# Patient Record
Sex: Female | Born: 1960 | Race: White | Hispanic: No | Smoking: Never smoker
Health system: Southern US, Community
[De-identification: ages and names within clinical notes are randomized; demographics above are authoritative.]

## PROBLEM LIST (undated history)

## (undated) DIAGNOSIS — G5600 Carpal tunnel syndrome, unspecified upper limb: Secondary | ICD-10-CM

## (undated) DIAGNOSIS — M1A9XX0 Chronic gout, unspecified, without tophus (tophi): Secondary | ICD-10-CM

## (undated) DIAGNOSIS — M5136 Other intervertebral disc degeneration, lumbar region: Secondary | ICD-10-CM

## (undated) DIAGNOSIS — K219 Gastro-esophageal reflux disease without esophagitis: Secondary | ICD-10-CM

## (undated) DIAGNOSIS — F341 Dysthymic disorder: Secondary | ICD-10-CM

## (undated) HISTORY — DX: Gastro-esophageal reflux disease without esophagitis: K21.9

## (undated) HISTORY — DX: Carpal tunnel syndrome, unspecified upper limb: G56.00

## (undated) HISTORY — DX: Dysthymic disorder: F34.1

## (undated) HISTORY — DX: Other intervertebral disc degeneration, lumbar region: M51.36

## (undated) HISTORY — PX: OOPHORECTOMY: SHX86

## (undated) HISTORY — DX: Chronic gout, unspecified, without tophus (tophi): M1A.9XX0

## (undated) HISTORY — PX: ABDOMINAL HYSTERECTOMY: SHX81

---

## 2006-06-28 ENCOUNTER — Ambulatory Visit (HOSPITAL_COMMUNITY)
Admission: RE | Admit: 2006-06-28 | Discharge: 2006-06-28 | Payer: Self-pay | Admitting: Physical Medicine and Rehabilitation

## 2009-01-22 ENCOUNTER — Ambulatory Visit: Payer: Self-pay | Admitting: Radiology

## 2009-01-22 ENCOUNTER — Emergency Department (HOSPITAL_BASED_OUTPATIENT_CLINIC_OR_DEPARTMENT_OTHER): Admission: EM | Admit: 2009-01-22 | Discharge: 2009-01-22 | Payer: Self-pay | Admitting: Emergency Medicine

## 2009-09-23 IMAGING — CR DG CERVICAL SPINE COMPLETE 4+V
7 series · 7 of 7 positions shown · non-contrast
Comparison: None

CLINICAL DATA: Trauma

CERVICAL SPINE - COMPLETE 4+ VIEW

[w c-spine lat]
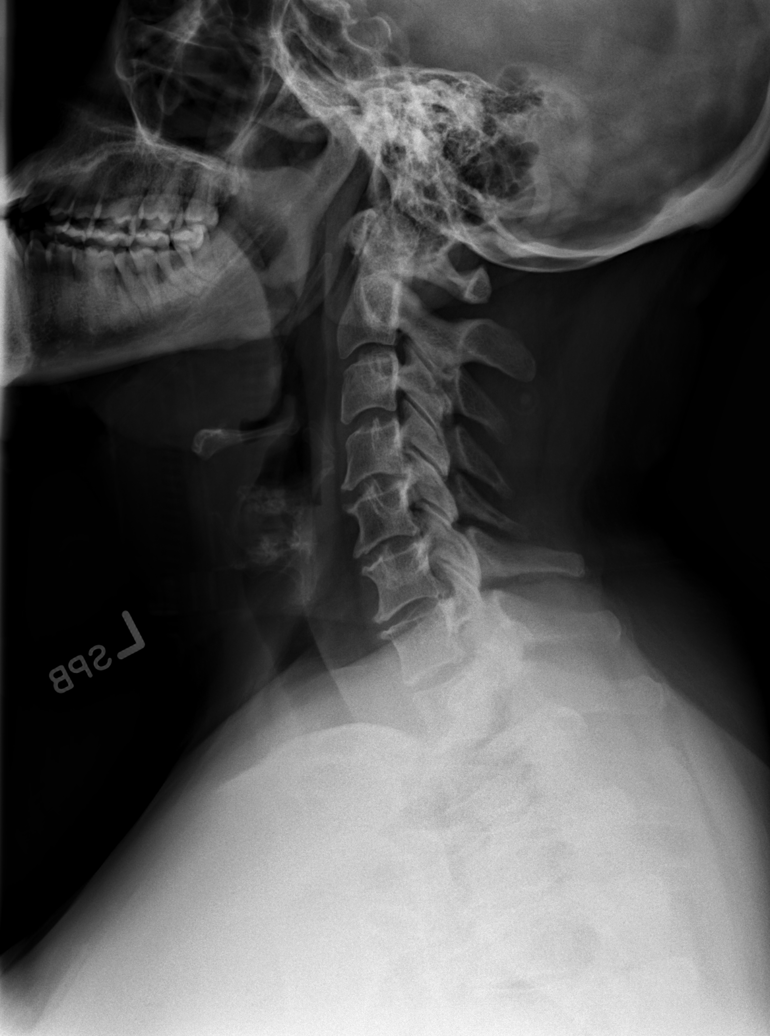

[w c-spine oblique (1 of 2)]
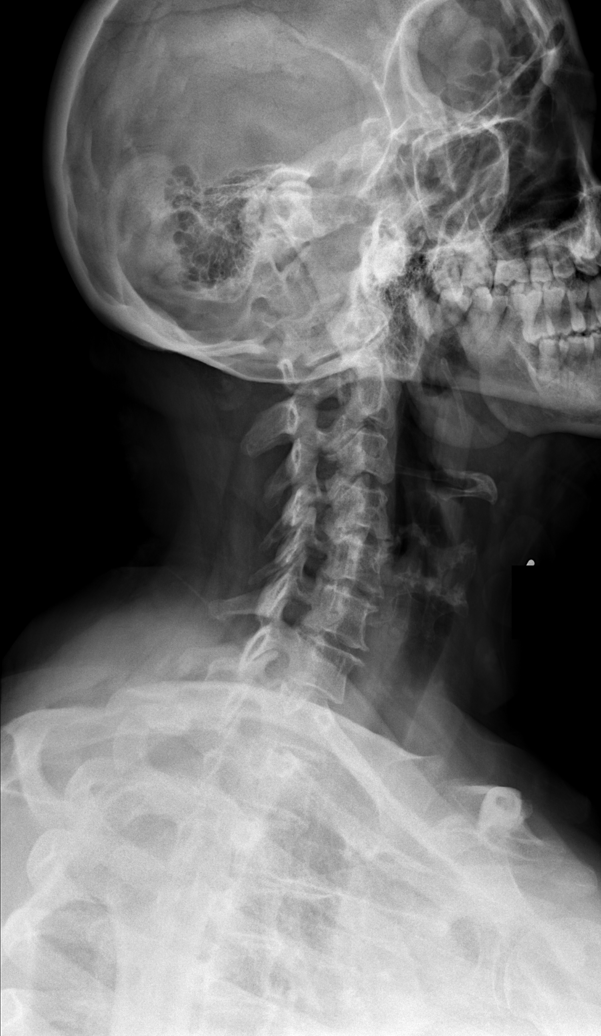

[w c-spine oblique (2 of 2)]
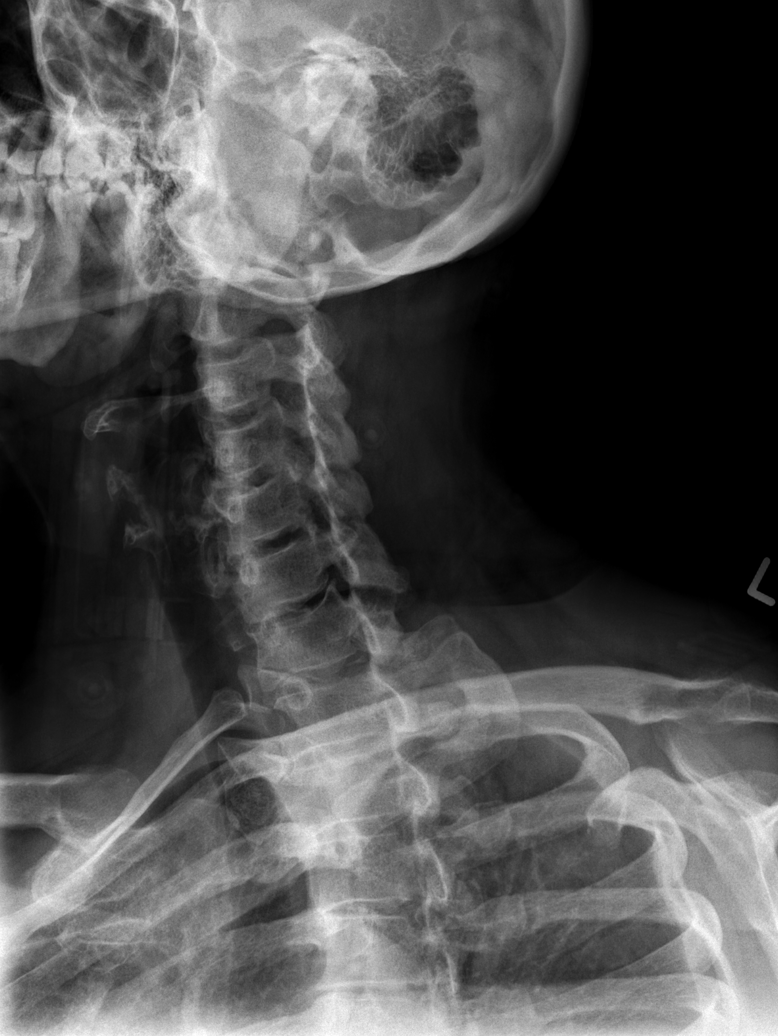

[w c-spine a.p.]
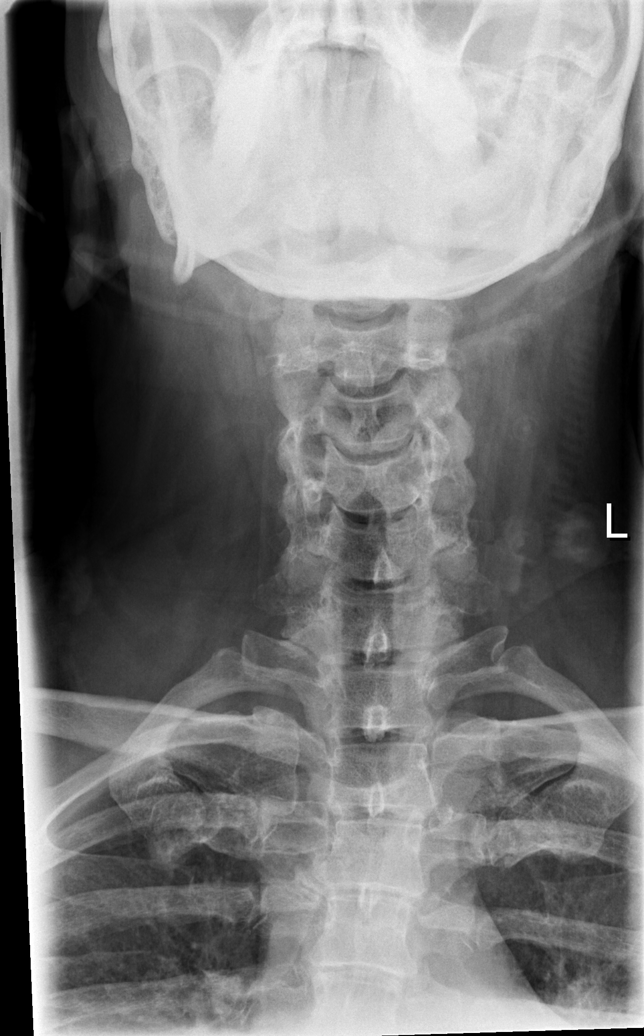

[w c-spine odontoid (1 of 2)]
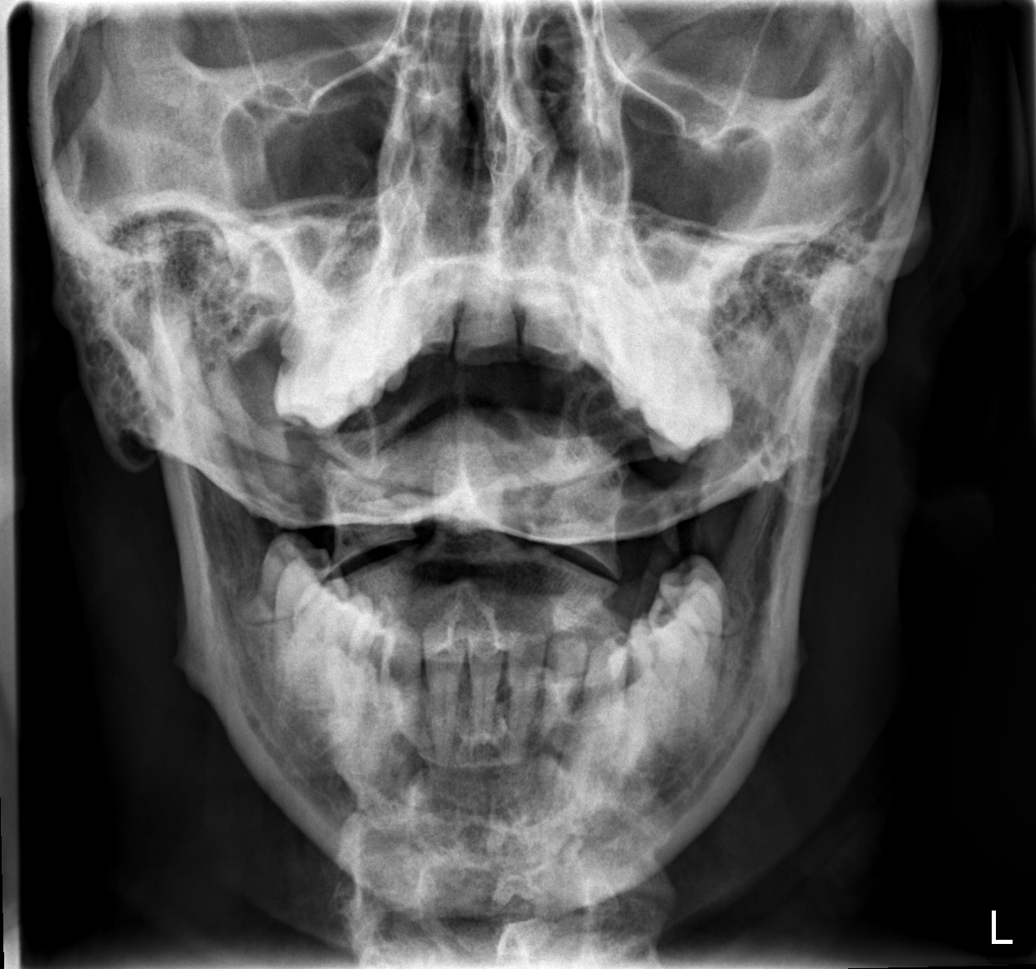

[w c-spine odontoid (2 of 2)]
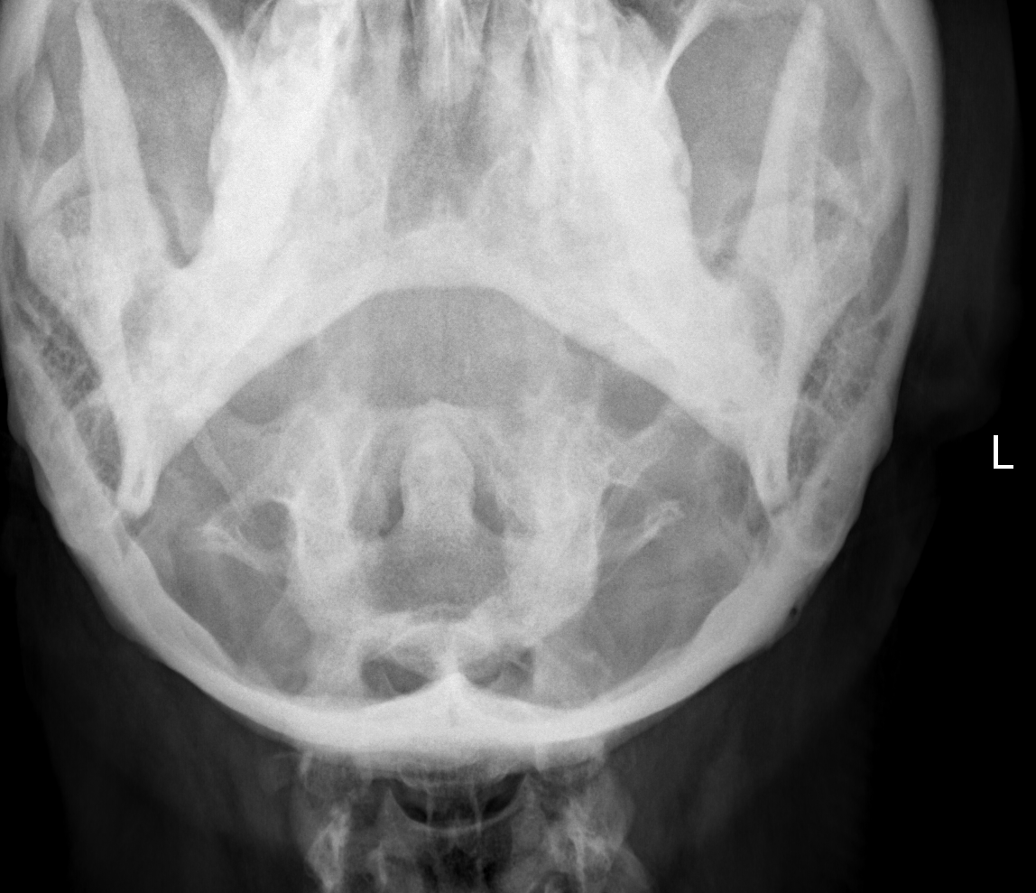

[w swimmers view]
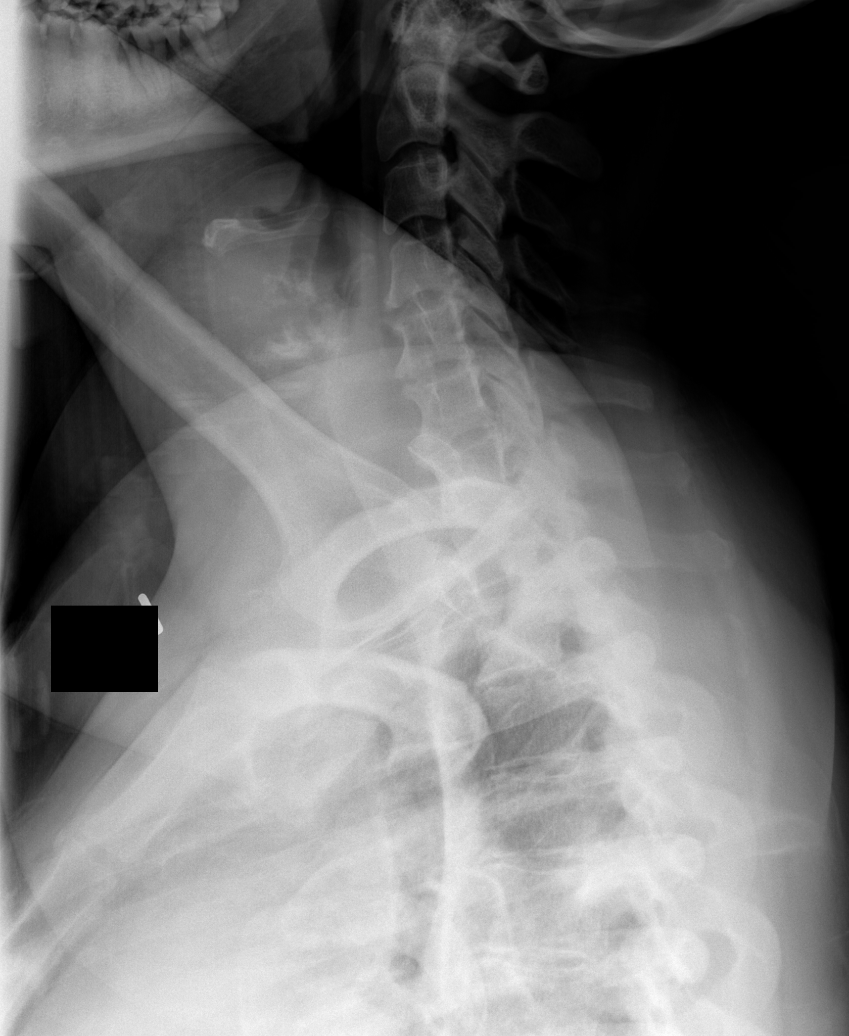

[7 of 7 positions shown; findings below may reference images not displayed]

FINDINGS: Prevertebral soft tissues normal.  Anatomic alignment.
Degenerative disc disease is noted at the C4-C5, C5-C6 and C6-C7
levels.  Facet degenerative changes within the mid cervical spine.
The craniocervical and the cervical thoracic junctions are intact.
IMPRESSION: There is no evidence for fracture, subluxation or static signs of
instability.

## 2009-09-23 IMAGING — CT CT HEAD W/O CM
1 series · 16 of 30 positions shown, 20 images · non-contrast
Comparison: 

CLINICAL DATA: Trauma

CT HEAD WITHOUT CONTRAST
TECHNIQUE: Contiguous axial images were obtained from the base of
the skull through the vertex without contrast.

[Series 2: head 4.8 h37s · axial · 0.43mm/px · z∈[-116,+24]mm · 16 of 32 slices shown, 20 images]
[im 2/32  brain]
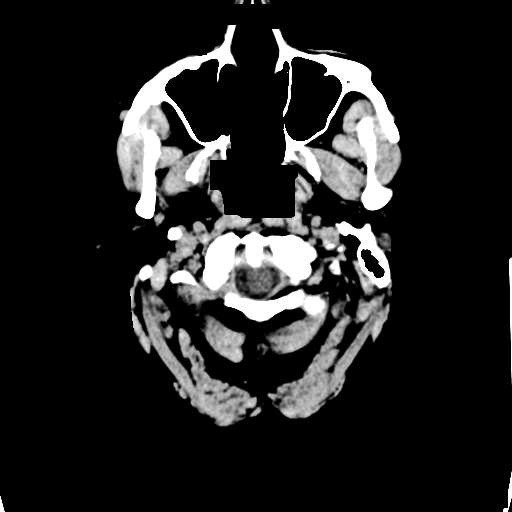
[im 2/32  bone]
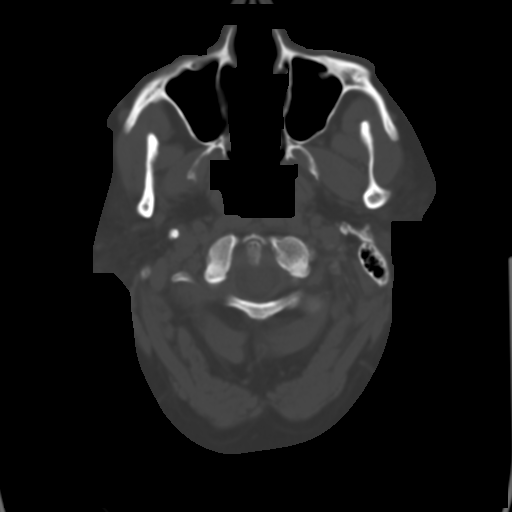
[im 4/32  brain]
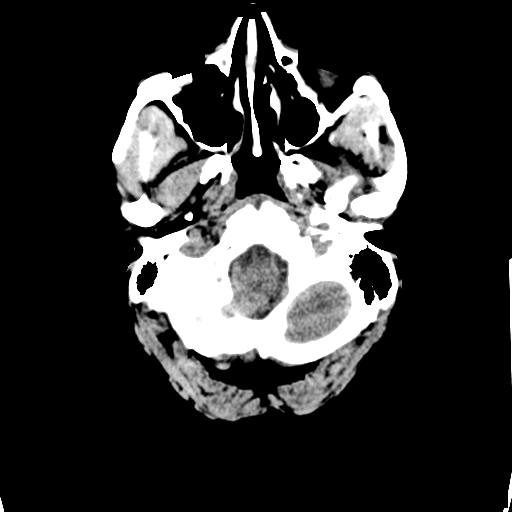
[im 6/32  brain]
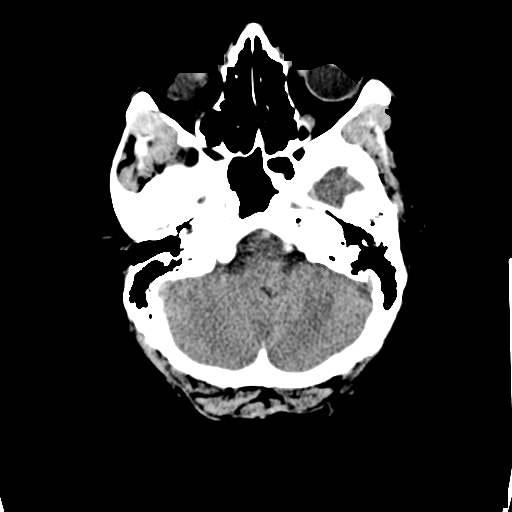
[im 8/32  brain]
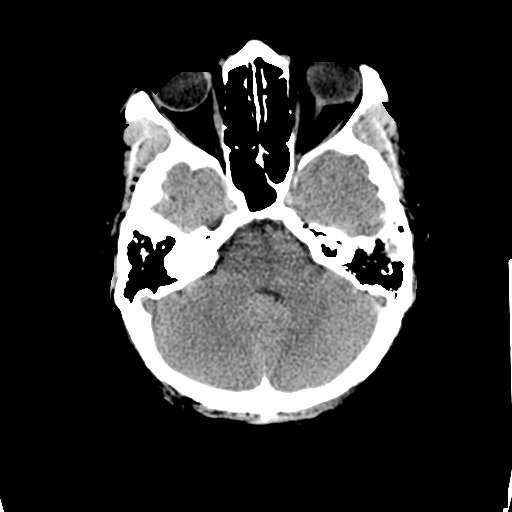
[im 9/32  brain]
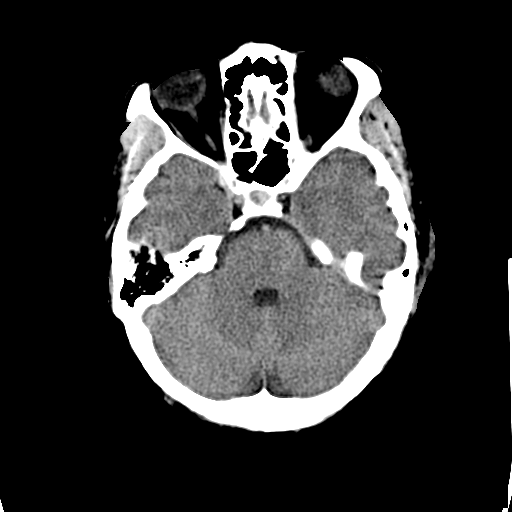
[im 9/32  bone]
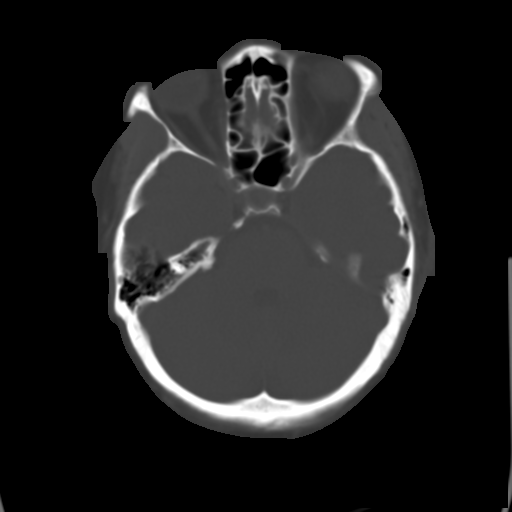
[im 11/32  brain]
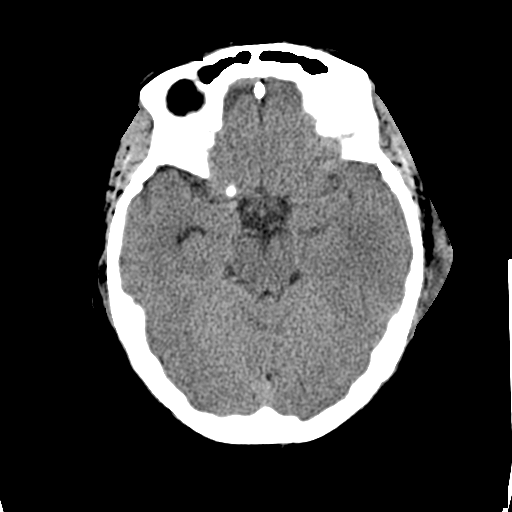
[im 13/32  brain]
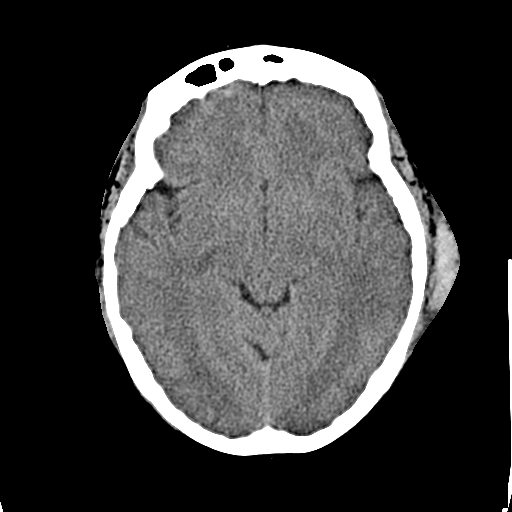
[im 15/32  brain]
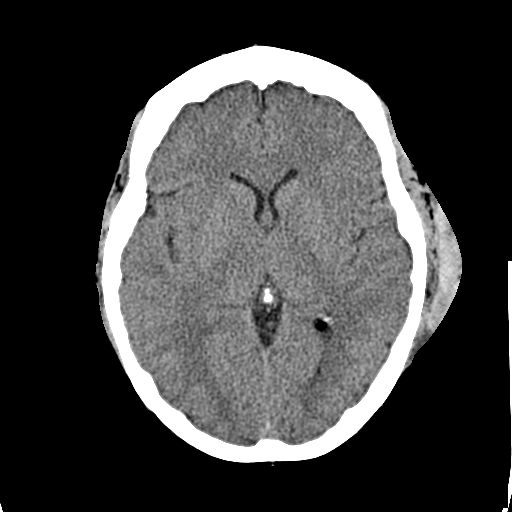
[im 17/32  brain]
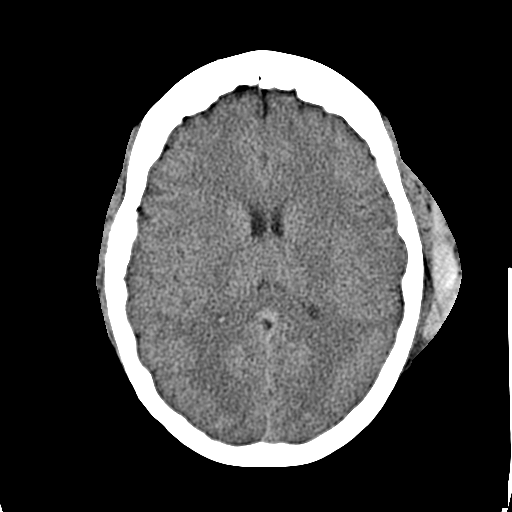
[im 17/32  bone]
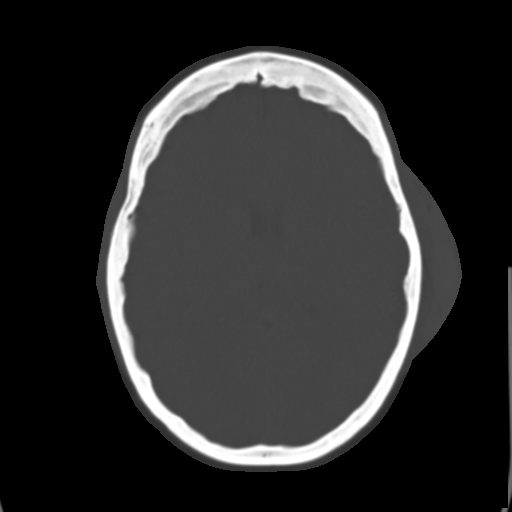
[im 19/32  brain]
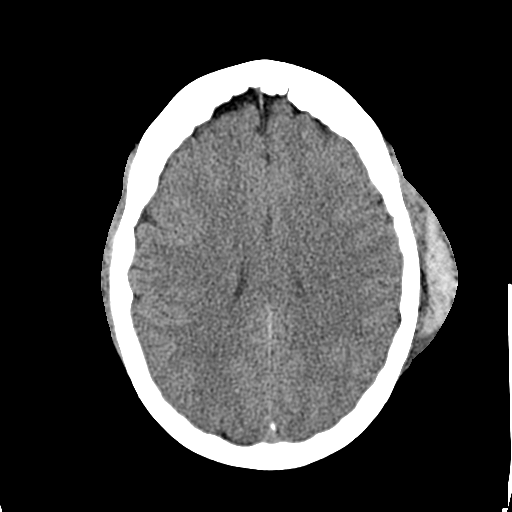
[im 21/32  brain]
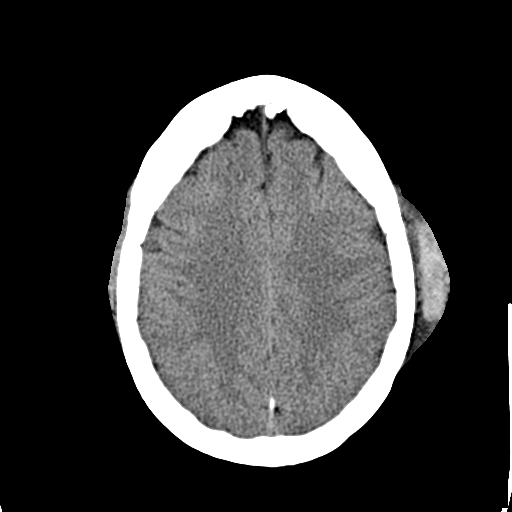
[im 23/32  brain]
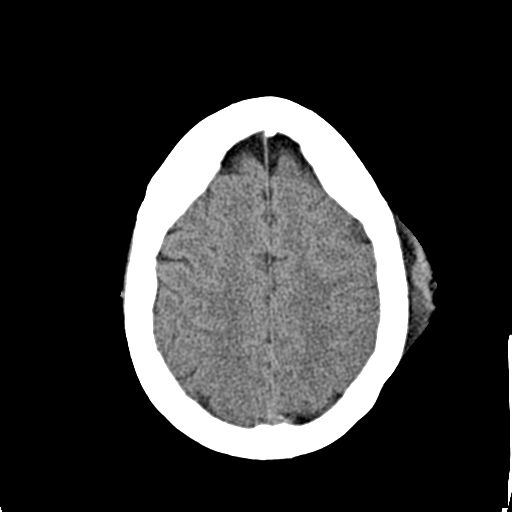
[im 24/32  brain]
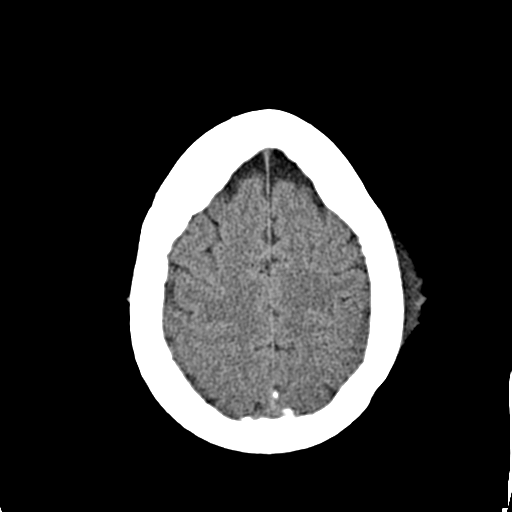
[im 24/32  bone]
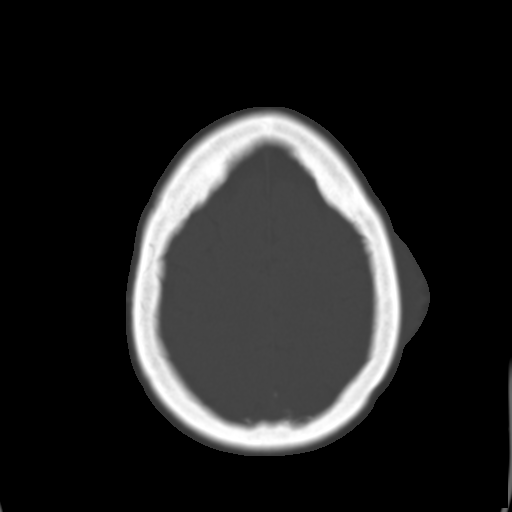
[im 26/32  brain]
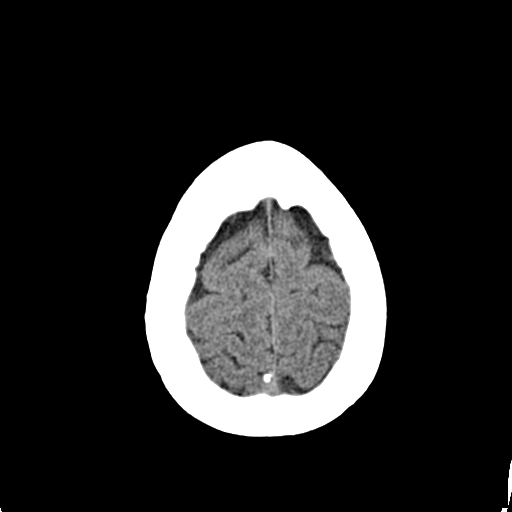
[im 28/32  brain]
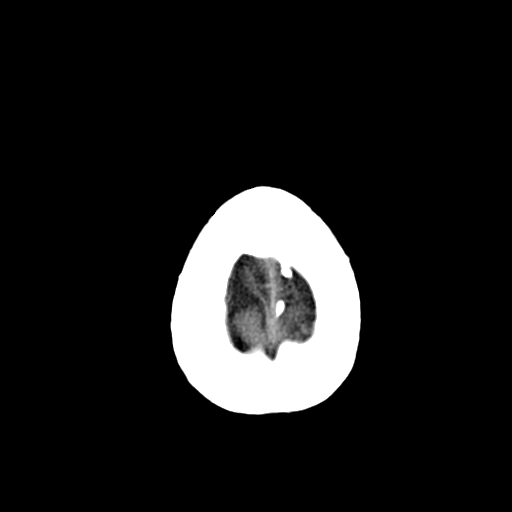
[im 30/32  brain]
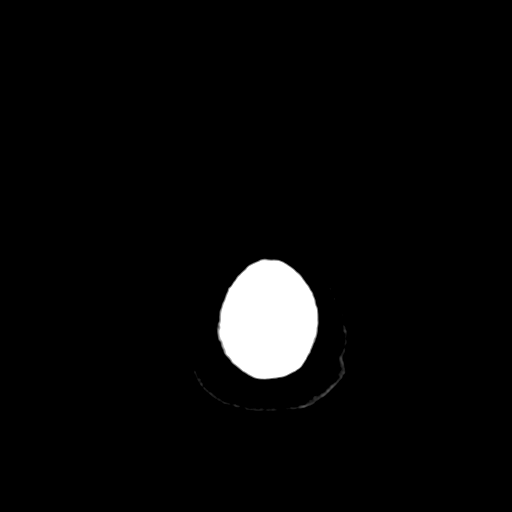

[16 of 30 positions shown; findings below may reference images not displayed]

FINDINGS: A left lateral scalp hematoma is identified.  No
underlying fracture.  The ventricles and basilar cisterns are
midline without effacement or mass effect.  There is no acute
intracranial hemorrhage or edema.  The skull base is intact.
Sinuses clear
IMPRESSION: Left scalp hematoma.  No underlying fracture.  No intracranial
hemorrhage or edema.

## 2010-10-14 ENCOUNTER — Encounter: Payer: Self-pay | Admitting: Physical Medicine and Rehabilitation

## 2013-08-11 ENCOUNTER — Other Ambulatory Visit: Payer: Self-pay | Admitting: Physical Medicine and Rehabilitation

## 2013-08-11 DIAGNOSIS — G894 Chronic pain syndrome: Secondary | ICD-10-CM

## 2013-08-11 DIAGNOSIS — M4802 Spinal stenosis, cervical region: Secondary | ICD-10-CM

## 2013-08-11 DIAGNOSIS — M5030 Other cervical disc degeneration, unspecified cervical region: Secondary | ICD-10-CM

## 2013-08-11 DIAGNOSIS — M5412 Radiculopathy, cervical region: Secondary | ICD-10-CM

## 2013-08-11 DIAGNOSIS — M5020 Other cervical disc displacement, unspecified cervical region: Secondary | ICD-10-CM

## 2013-08-21 ENCOUNTER — Other Ambulatory Visit: Payer: Self-pay

## 2016-02-29 DIAGNOSIS — I10 Essential (primary) hypertension: Secondary | ICD-10-CM

## 2016-02-29 DIAGNOSIS — E782 Mixed hyperlipidemia: Secondary | ICD-10-CM | POA: Insufficient documentation

## 2016-02-29 DIAGNOSIS — F064 Anxiety disorder due to known physiological condition: Secondary | ICD-10-CM

## 2016-02-29 HISTORY — DX: Essential (primary) hypertension: I10

## 2016-02-29 HISTORY — DX: Mixed hyperlipidemia: E78.2

## 2016-02-29 HISTORY — DX: Anxiety disorder due to known physiological condition: F06.4

## 2016-10-15 ENCOUNTER — Other Ambulatory Visit: Payer: Self-pay | Admitting: Pain Medicine

## 2016-10-31 ENCOUNTER — Other Ambulatory Visit: Payer: Self-pay | Admitting: Pain Medicine

## 2016-11-28 ENCOUNTER — Other Ambulatory Visit: Payer: Self-pay | Admitting: Pain Medicine

## 2018-08-15 DIAGNOSIS — K5909 Other constipation: Secondary | ICD-10-CM

## 2018-08-15 HISTORY — DX: Other constipation: K59.09

## 2020-05-27 DIAGNOSIS — I959 Hypotension, unspecified: Secondary | ICD-10-CM

## 2020-05-27 DIAGNOSIS — R079 Chest pain, unspecified: Secondary | ICD-10-CM

## 2020-06-02 DIAGNOSIS — R5383 Other fatigue: Secondary | ICD-10-CM | POA: Insufficient documentation

## 2020-06-02 DIAGNOSIS — K219 Gastro-esophageal reflux disease without esophagitis: Secondary | ICD-10-CM | POA: Insufficient documentation

## 2020-06-02 DIAGNOSIS — K625 Hemorrhage of anus and rectum: Secondary | ICD-10-CM | POA: Insufficient documentation

## 2020-06-02 DIAGNOSIS — R5381 Other malaise: Secondary | ICD-10-CM | POA: Insufficient documentation

## 2020-06-10 ENCOUNTER — Encounter: Payer: Self-pay | Admitting: Physician Assistant

## 2020-07-12 DIAGNOSIS — E349 Endocrine disorder, unspecified: Secondary | ICD-10-CM

## 2020-07-12 DIAGNOSIS — F419 Anxiety disorder, unspecified: Secondary | ICD-10-CM | POA: Insufficient documentation

## 2020-07-12 HISTORY — DX: Endocrine disorder, unspecified: E34.9

## 2020-07-13 ENCOUNTER — Ambulatory Visit: Payer: Medicare Other | Admitting: Physician Assistant

## 2020-07-13 ENCOUNTER — Encounter: Payer: Self-pay | Admitting: Physician Assistant

## 2020-07-13 VITALS — BP 90/60 | HR 64 | Ht 63.0 in | Wt 173.0 lb

## 2020-07-13 DIAGNOSIS — K5900 Constipation, unspecified: Secondary | ICD-10-CM | POA: Diagnosis not present

## 2020-07-13 DIAGNOSIS — K625 Hemorrhage of anus and rectum: Secondary | ICD-10-CM

## 2020-07-13 DIAGNOSIS — Z800 Family history of malignant neoplasm of digestive organs: Secondary | ICD-10-CM

## 2020-07-13 MED ORDER — PLENVU 140 G PO SOLR: 1.0000 | ORAL | 0 refills | Status: DC

## 2020-07-13 NOTE — Progress Notes (Signed)
Subjective:    Patient ID: Cassie Sandoval, female    DOB: 1961/08/12, 59 y.o.   MRN: [id number]  HPI Cassie Sandoval is a pleasant 59 year old female, new to GI today referred by her PCP/Melissa Brown-Patrim  NP for evaluation of rectal bleeding and to discuss colonoscopy. Patient also relates chronic history of constipation. She has history of hypertension, GERD and anxiety. She had undergone prior colonoscopy with Dr. Lyda Jester, she relates she did not want to return to him as she did not have adequate sedation at the time of her colonoscopy done in December 2016.  She was found to have mild diverticulosis in the left colon and small internal hemorrhoids, no polyps. Patient has family history of colon cancer in her daughter diagnosed at age 14. Patient says she has been noticing very small amounts of blood off and on over the past few months and occasionally a small amount of bright red blood in the commode.  Blood is primarily been noted on the tissue and has not been mixed with her bowel movements.  She denies any rectal pain or discomfort.  She has long-term history of constipation dating back years.  She says she may go as long as 2 to 3 weeks without a bowel movement.  She uses Ex-Lax as needed but not on daily basis.  When questioned she says she has tried MiraLAX in the past but has not tried taking it on a regular basis. After recent visit with primary care she was noted to be heme positive.  Review of Systems Pertinent positive and negative review of systems were noted in the above HPI section.  All other review of systems was otherwise negative.  Outpatient Encounter Medications as of 07/13/2020  Medication Sig  . allopurinol (ZYLOPRIM) 100 MG tablet Take 1 tablet by mouth daily.  Marland Kitchen ascorbic acid (VITAMIN C) 1000 MG tablet Take 1 tablet by mouth daily.  Marland Kitchen b complex vitamins capsule Take 1 capsule by mouth daily.  Marland Kitchen escitalopram (LEXAPRO) 5 MG tablet Take 1 tablet by mouth daily.  Marland Kitchen estradiol  (ESTRACE) 0.5 MG tablet Take 1 tablet by mouth daily.  . fentaNYL (DURAGESIC) 75 MCG/HR Place 1 patch onto the skin every 3 (three) days.  Marland Kitchen gabapentin (NEURONTIN) 100 MG capsule Take 3 capsules by mouth in the morning, at noon, and at bedtime.   . Lactobacillus Rhamnosus, GG, (RA PROBIOTIC DIGESTIVE CARE) CAPS Take 1 capsule by mouth daily.  . Multiple Vitamin (MULTI-VITAMIN) tablet Take 1 tablet by mouth daily.  . naloxone (NARCAN) 4 MG/0.1ML LIQD nasal spray kit Call 911 if opioid overdose. Inject spray into nostril. If no response after 2 min,, inject spray into opposite nostril.  . Oxycodone HCl 20 MG TABS Take 0.5-1 tablets by mouth every 6 (six) hours as needed.  . pantoprazole (PROTONIX) 40 MG tablet Take 1 tablet by mouth daily.  . simvastatin (ZOCOR) 40 MG tablet Take 1 tablet by mouth daily.  Marland Kitchen tiZANidine (ZANAFLEX) 4 MG tablet Take 0.5 tablets by mouth 2 (two) times daily.  Marland Kitchen venlafaxine XR (EFFEXOR-XR) 150 MG 24 hr capsule Take 1 capsule by mouth daily.  Marland Kitchen amitriptyline (ELAVIL) 10 MG tablet Take 1-2 tablets by mouth at bedtime. (Patient not taking: Reported on 07/13/2020)  . PEG-KCl-NaCl-NaSulf-Na Asc-C (PLENVU) 140 g SOLR Take 1 kit by mouth as directed.   No facility-administered encounter medications on file as of 07/13/2020.   Allergies  Allergen Reactions  . Morphine Nausea Only    Patient states that it  paralyzes her stomach    Patient Active Problem List   Diagnosis Date Noted  . Anxiety 07/12/2020  . Hormone disorder 07/12/2020  . Gastroesophageal reflux disease without esophagitis 06/02/2020  . Malaise and fatigue 06/02/2020  . Painless rectal bleeding 06/02/2020  . Chronic constipation 08/15/2018  . Anxiety disorder due to medical condition 02/29/2016  . Essential hypertension 02/29/2016  . Mixed hyperlipidemia 02/29/2016   Social History   Socioeconomic History  . Marital status: Widowed    Spouse name: Not on file  . Number of children: 2  . Years of  education: Not on file  . Highest education level: Not on file  Occupational History  . Not on file  Tobacco Use  . Smoking status: Never Smoker  . Smokeless tobacco: Never Used  Vaping Use  . Vaping Use: Never used  Substance and Sexual Activity  . Alcohol use: Not Currently  . Drug use: Never  . Sexual activity: Not Currently  Other Topics Concern  . Not on file  Social History Narrative  . Not on file   Social Determinants of Health   Financial Resource Strain:   . Difficulty of Paying Living Expenses: Not on file  Food Insecurity:   . Worried About Charity fundraiser in the Last Year: Not on file  . Ran Out of Food in the Last Year: Not on file  Transportation Needs:   . Lack of Transportation (Medical): Not on file  . Lack of Transportation (Non-Medical): Not on file  Physical Activity:   . Days of Exercise per Week: Not on file  . Minutes of Exercise per Session: Not on file  Stress:   . Feeling of Stress : Not on file  Social Connections:   . Frequency of Communication with Friends and Family: Not on file  . Frequency of Social Gatherings with Friends and Family: Not on file  . Attends Religious Services: Not on file  . Active Member of Clubs or Organizations: Not on file  . Attends Archivist Meetings: Not on file  . Marital Status: Not on file  Intimate Partner Violence:   . Fear of Current or Ex-Partner: Not on file  . Emotionally Abused: Not on file  . Physically Abused: Not on file  . Sexually Abused: Not on file    Ms. 23 family history includes CAD in her father; COPD in her father; Cancer in her mother; Colon cancer (age of onset: 52) in her daughter; Heart disease in her mother.      Objective:    Vitals:   07/13/20 1509  BP: 90/60  Pulse: 64    Physical Exam Well-developed well-nourished WF in no acute distress.  Height, Weight,173 BMI 30.6  HEENT; nontraumatic normocephalic, EOMI, PE RR LA, sclera anicteric. Oropharynx;not  done Neck; supple, no JVD Cardiovascular; regular rate and rhythm with S1-S2, no murmur rub or gallop Pulmonary; Clear bilaterally Abdomen; soft, nontender, nondistended, no palpable mass or hepatosplenomegaly, bowel sounds are active Rectal;not done today Skin; benign exam, no jaundice rash or appreciable lesions Extremities; no clubbing cyanosis or edema skin warm and dry Neuro/Psych; alert and oriented x4, grossly nonfocal mood and affect appropriate       Assessment & Plan:   #59 59 year old white female with intermittent low-grade bright red blood with bowel movements, chronic significant constipation and family history of colon cancer in patient's daughter diagnosed at age 51.  Patient had colonoscopy in December 2016 per Dr. Lyda Jester with finding of mild  diverticulosis and small internal hemorrhoids, no polyps. Due for 5-year interval follow-up  #2 hypertension #3.  Anxiety #4.  Chronic pain syndrome/chronic degenerative disc disease-narcotic dependent  Plan; patient will be scheduled for colonoscopy with Dr. Lyndel Safe.  Procedure was discussed in detail with patient including indications risks and benefits and she is agreeable to proceed Patient has completed COVID-19 vaccination. Start trial of MiraLAX 17 g in 8 ounces of water every day, we also discussed titrating the dose to 1-1/2 doses daily as needed.  She is asked to try this for a couple of weeks.  If she does not have good success with MiraLAX I have also given her samples of Linzess 145 mcg which she will then start once daily. Asked her to call back if she would like a prescription for Linzess and/or if the current dose of Linzess is ineffective. Continue liberal water intake at least 60 to 70 ounces per day.  Taleen Prosser S Fatiha Guzy PA-C 07/13/2020   Cc: Bess Harvest*

## 2020-07-13 NOTE — Patient Instructions (Signed)
If you are age 60 or older, your body mass index should be between 23-30. Your Body mass index is 30.65 kg/m. If this is out of the aforementioned range listed, please consider follow up with your Primary Care Provider.  If you are age 77 or younger, your body mass index should be between 19-25. Your Body mass index is 30.65 kg/m. If this is out of the aformentioned range listed, please consider follow up with your Primary Care Provider.   You have been scheduled for a colonoscopy. Please follow written instructions given to you at your visit today.  Please pick up your prep supplies at the pharmacy within the next 1-3 days. If you use inhalers (even only as needed), please bring them with you on the day of your procedure.  START Miralax 17 grams in 8 ounces of water or juice every day for 1-2 weeks, if this is not helping, then start Linzess 145 mcg 1 capsule daily. (Samples given to you today) If the Linzess helps call the office for a prescription to be sent to your pharmacy.  Follow up pending the results of your Colonoscopy or as needed.

## 2020-07-14 NOTE — Progress Notes (Signed)
Agree with excellent plan. RG 

## 2020-07-20 ENCOUNTER — Telehealth: Payer: Self-pay | Admitting: Gastroenterology

## 2020-07-20 NOTE — Telephone Encounter (Signed)
I have called and spoke to patient and switched her to Miralax prep. I have went over this with patient and patient voiced understanding.

## 2020-07-21 ENCOUNTER — Other Ambulatory Visit: Payer: Self-pay

## 2020-07-21 ENCOUNTER — Ambulatory Visit: Payer: Medicare Other | Admitting: Gastroenterology

## 2020-07-21 ENCOUNTER — Encounter: Payer: Self-pay | Admitting: Gastroenterology

## 2020-07-21 VITALS — BP 97/52 | HR 56 | Temp 97.1°F | Resp 15 | Ht 63.0 in | Wt 173.0 lb

## 2020-07-21 DIAGNOSIS — Z800 Family history of malignant neoplasm of digestive organs: Secondary | ICD-10-CM

## 2020-07-21 MED ORDER — SODIUM CHLORIDE 0.9 % IV SOLN: 500.0000 mL | Freq: Once | INTRAVENOUS | Status: DC

## 2020-07-21 NOTE — Patient Instructions (Signed)
HANDOUTS PROVIDED ON: DIVERTICULOSIS & HEMORRHOIDS  You may resume your previous diet and medication schedule.  Start taking 17 g or Miralax in 8 oz of water daily.  Thank you for allowing [place] to care for you today!!!   YOU HAD AN ENDOSCOPIC PROCEDURE TODAY AT THE [facility]:   Refer to the procedure report that was given to you for any specific questions about what was found during the examination.  If the procedure report does not answer your questions, please call your gastroenterologist to clarify.  If you requested that your care partner not be given the details of your procedure findings, then the procedure report has been included in a sealed envelope for you to review at your convenience later.  YOU SHOULD EXPECT: Some feelings of bloating in the abdomen. Passage of more gas than usual.  Walking can help get rid of the air that was put into your GI tract during the procedure and reduce the bloating. If you had a lower endoscopy (such as a colonoscopy or flexible sigmoidoscopy) you may notice spotting of blood in your stool or on the toilet paper. If you underwent a bowel prep for your procedure, you may not have a normal bowel movement for a few days.  Please Note:  You might notice some irritation and congestion in your nose or some drainage.  This is from the oxygen used during your procedure.  There is no need for concern and it should clear up in a day or so.  SYMPTOMS TO REPORT IMMEDIATELY:   Following lower endoscopy (colonoscopy or flexible sigmoidoscopy):  Excessive amounts of blood in the stool  Significant tenderness or worsening of abdominal pains  Swelling of the abdomen that is new, acute  Fever of 100F or higher  For urgent or emergent issues, a gastroenterologist can be reached at any hour by calling [phone]. Do not use MyChart messaging for urgent concerns.    DIET:  We do recommend a small meal at first, but then you may proceed to your regular  diet.  Drink plenty of fluids but you should avoid alcoholic beverages for 24 hours.  ACTIVITY:  You should plan to take it easy for the rest of today and you should NOT DRIVE or use heavy machinery until tomorrow (because of the sedation medicines used during the test).    FOLLOW UP: Our staff will call the number listed on your recordsMonday morning between 7:15 am and 8:15 am  check on you and address any questions or concerns that you may have regarding the information given to you following your procedure. If we do not reach you, we will leave a message.  We will attempt to reach you two times.  During this call, we will ask if you have developed any symptoms of COVID 19. If you develop any symptoms (ie: fever, flu-like symptoms, shortness of breath, cough etc.) before then, please call [phone].  If you test positive for Covid 19 in the 2 weeks post procedure, please call and report this information to [place].    If any biopsies were taken you will be contacted by phone or by letter within the next 1-3 weeks.  Please call us at [phone] if you have not heard about the biopsies in 3 weeks.    SIGNATURES/CONFIDENTIALITY: You and/or your care partner have signed paperwork which will be entered into your electronic medical record.  These signatures attest to the fact that that the information above on your  After Visit Summary has been reviewed and is understood.  Full responsibility of the confidentiality of this discharge information lies with you and/or your care-partner.

## 2020-07-21 NOTE — Op Note (Addendum)
[facility] Patient Name: Cassie Sandoval Procedure Date: 07/21/2020 3:06 PM MRN: [id number] Endoscopist: Lynann Bologna , MD Age: 60 Referring MD:  Date of Birth: 09/08/1961 Gender: Female Account #: [id number] Procedure:                Colonoscopy Indications:              Screening in patient at increased risk: Colorectal                            cancer -daughter at age 75. Medicines:                Monitored Anesthesia Care Procedure:                Pre-Anesthesia Assessment:                           - Prior to the procedure, a History and Physical                            was performed, and patient medications and                            allergies were reviewed. The patient's tolerance of                            previous anesthesia was also reviewed. The risks                            and benefits of the procedure and the sedation                            options and risks were discussed with the patient.                            All questions were answered, and informed consent                            was obtained. Prior Anticoagulants: The patient has                            taken no previous anticoagulant or antiplatelet                            agents. ASA Grade Assessment: III - A patient with                            severe systemic disease. After reviewing the risks                            and benefits, the patient was deemed in                            satisfactory condition to undergo the procedure.  After obtaining informed consent, the colonoscope                            was passed under direct vision. Throughout the                            procedure, the patient's blood pressure, pulse, and                            oxygen saturations were monitored continuously. The                            Colonoscope was introduced through the anus and                            advanced to the the cecum,  identified by                            appendiceal orifice and ileocecal valve. The                            colonoscopy was performed without difficulty. The                            patient tolerated the procedure well. The quality                            of the bowel preparation was adequate to identify                            polyps. Some retained stool. Aggressive suctioning                            and aspiration was performed. The TI, ileocecal                            valve, appendiceal orifice, and rectum were                            photographed. Scope In: 3:16:41 PM Scope Out: 3:32:59 PM Scope Withdrawal Time: 0 hours 10 minutes 5 seconds  Total Procedure Duration: 0 hours 16 minutes 18 seconds  Findings:                 Multiple medium-mouthed diverticula were found in                            the sigmoid colon and descending colon.                           Non-bleeding internal hemorrhoids were found during                            retroflexion. The hemorrhoids were small.  The exam was otherwise without abnormality on                            direct and retroflexion views.                           The terminal ileum appeared normal. Complications:            No immediate complications. Estimated Blood Loss:     Estimated blood loss: none. Impression:               - Moderate left colonic diverticulosis.                           - Non-bleeding internal hemorrhoids.                           - The examination was otherwise normal on direct                            and retroflexion views.                           - No specimens collected. Recommendation:           - Patient has a contact number available for                            emergencies. The signs and symptoms of potential                            delayed complications were discussed with the                            patient. Return to normal activities  tomorrow.                            Written discharge instructions were provided to the                            patient.                           - Resume previous diet.                           - Miralax 1 capful (17 grams) in 8 ounces of water                            PO daily.                           - Repeat colonoscopy in 5 years for screening                            purposes. Earlier, if with any new problems or  change in family history.                           - Return to GI office PRN.                           - The findings and recommendations were discussed                            with Kathlene November. Lynann Bologna, MD 07/21/2020 3:38:06 PM This report has been signed electronically.

## 2020-07-21 NOTE — Progress Notes (Signed)
VS by CW. ?

## 2020-07-21 NOTE — Progress Notes (Signed)
A and O x3. Report to RN. Tolerated MAC anesthesia well.

## 2020-07-25 ENCOUNTER — Telehealth: Payer: Self-pay

## 2020-07-25 NOTE — Telephone Encounter (Signed)
  Follow up Call-  Call back number 07/21/2020  Post procedure Call Back phone  # [phone]  Permission to leave phone message Yes  Some recent data might be hidden     Patient questions:  Do you have a fever, pain , or abdominal swelling? No. Pain Score  0 *  Have you tolerated food without any problems? Yes.    Have you been able to return to your normal activities? Yes.    Do you have any questions about your discharge instructions: Diet   No. Medications  No. Follow up visit  No.  Do you have questions or concerns about your Care? No.  Actions: * If pain score is 4 or above: No action needed, pain <4.   1. Have you developed a fever since your procedure? No   2.   Have you had an respiratory symptoms (SOB or cough) since your procedure? No   3.   Have you tested positive for COVID 19 since your procedure? No   4.   Have you had any family members/close contacts diagnosed with the COVID 19 since your procedure?  No    If yes to any of these questions please route to Laverna Peace, RN and Karlton Lemon, RN
